# Patient Record
Sex: Male | Born: 1990 | Race: White | Hispanic: No | Marital: Married | Smoking: Never smoker
Health system: Southern US, Community
[De-identification: ages and names within clinical notes are randomized; demographics above are authoritative.]

---

## 2015-05-10 ENCOUNTER — Encounter (HOSPITAL_COMMUNITY): Payer: Self-pay | Admitting: Emergency Medicine

## 2015-05-10 ENCOUNTER — Emergency Department (INDEPENDENT_AMBULATORY_CARE_PROVIDER_SITE_OTHER): Payer: Self-pay

## 2015-05-10 ENCOUNTER — Emergency Department (HOSPITAL_COMMUNITY)
Admission: EM | Admit: 2015-05-10 | Discharge: 2015-05-10 | Disposition: A | Discharge status: Home / Self Care | Payer: Self-pay | Source: Home / Self Care | Attending: Emergency Medicine | Admitting: Emergency Medicine

## 2015-05-10 DIAGNOSIS — S9032XA Contusion of left foot, initial encounter: Secondary | ICD-10-CM

## 2015-05-10 MED ORDER — IBUPROFEN 800 MG PO TABS: 800.0000 mg | ORAL_TABLET | Freq: Three times a day (TID) | ORAL | Status: AC | PRN

## 2015-05-10 NOTE — Discharge Instructions (Signed)
You have some bruising of your heel. Nothing is broken. Apply ice as often as you can to help with the bruising and pain. Take ibuprofen 800 mg 3 times a day as needed for pain. Use the crutches as needed. If things are not improving in 1 month, please follow-up with the orthopedic doctor.

## 2015-05-10 NOTE — ED Notes (Signed)
Left ankle pain that started Saturday.  Patient was jumping on a trampoline, went through trampoline and injured left ankle.  Visible bruising around edge of heel. Pain below lateral ankle.  Able to move toes, palpable pulses.  Patient reports intermittent tingling in little toe

## 2015-05-10 NOTE — ED Provider Notes (Signed)
CSN: [id number]     Arrival date & time 05/10/15  1302 History   First MD Initiated Contact with Patient 05/10/15 1321     Chief Complaint  Patient presents with  . Ankle Pain   (Consider location/radiation/quality/duration/timing/severity/associated sxs/prior Treatment) HPI He is a 24 year old man here for evaluation of left foot injury. On Saturday, he was playing around on his children's trampoline when he went through the trampoline, landing hard on his left foot. He states he has been able to hobble around, but there is significant pain with weightbearing. He states the pain is mostly in the posterior ankle and heel. He has bruising and some swelling.  History reviewed. No pertinent past medical history. History reviewed. No pertinent past surgical history. No family history on file. Social History  Substance Use Topics  . Smoking status: Never Smoker   . Smokeless tobacco: None  . Alcohol Use: Yes    Review of Systems As in history of present illness Allergies  Review of patient's allergies indicates no known allergies.  Home Medications   Prior to Admission medications   Medication Sig Start Date End Date Taking? Authorizing Provider  acetaminophen (TYLENOL) 325 MG tablet Take 650 mg by mouth every 6 (six) hours as needed.   Yes Historical Provider, MD  ibuprofen (ADVIL,MOTRIN) 800 MG tablet Take 1 tablet (800 mg total) by mouth every 8 (eight) hours as needed for moderate pain. 05/10/15   Charm Rings, MD   BP 122/78 mmHg  Pulse 63  Temp(Src) 98.5 F (36.9 C) (Oral)  Resp 14  SpO2 99% Physical Exam  Constitutional: He is oriented to person, place, and time. He appears well-developed and well-nourished. No distress.  Cardiovascular: Normal rate.   Pulmonary/Chest: Effort normal.  Musculoskeletal:  Left ankle: He has bruising across the posterior heel as well as the dorsal foot. He is tender at the posterior edge of the lateral malleolus. He is also tender across the  posterior heel and ankle. 2+ DP pulse. He is able to wiggle his toes. Sensation grossly intact to light touch.  Neurological: He is alert and oriented to person, place, and time.    ED Course  Procedures (including critical care time) Labs Review Labs Reviewed - No data to display  Imaging Review Dg Ankle Complete Left  05/10/2015   CLINICAL DATA:  [place] Seat when trampoline broke while jumping on it 3 days ago, foot injury, injured medial malleolar region of LEFT ankle, constant pain, hematoma  EXAM: LEFT ANKLE COMPLETE - 3+ VIEW  COMPARISON:  None  FINDINGS: Diffuse soft tissue swelling.  Ankle mortise intact.  Osseous mineralization normal.  No acute fracture, dislocation, or bone destruction.  IMPRESSION: No acute osseous abnormalities.   Electronically Signed   By: Ulyses Southward M.D.   On: 05/10/2015 14:22   Dg Foot Complete Left  05/10/2015   CLINICAL DATA:  Patient injured his foot jumping on trampoline 3 days ago. Medial left foot hematoma.  EXAM: LEFT FOOT - COMPLETE 3+ VIEW  COMPARISON:  None.  FINDINGS: No evidence of acute fracture or subluxation. No focal bone lesion or bone destruction. Bone cortex and trabecular architecture appear intact. No radiopaque soft tissue foreign bodies.  IMPRESSION: No acute osseous abnormality identified.   Electronically Signed   By: Ted Mcalpine M.D.   On: 05/10/2015 14:40     MDM   1. Contusion of left heel, initial encounter    X-rays negative. Crutches given to use as needed. Recommended frequent icing  and ibuprofen as needed. Follow-up with orthopedics if no improvement in the next month.    Charm Rings, MD 05/10/15 1500

## 2016-08-20 IMAGING — DX DG ANKLE COMPLETE 3+V*L*
3 series · 3 of 3 positions shown · non-contrast
Comparison: None

CLINICAL DATA: Fell when trampoline broke while jumping on it 3
days ago, foot injury, injured medial malleolar region of LEFT
ankle, constant pain, hematoma

EXAM:
LEFT ANKLE COMPLETE - 3+ VIEW

[ankle ap]
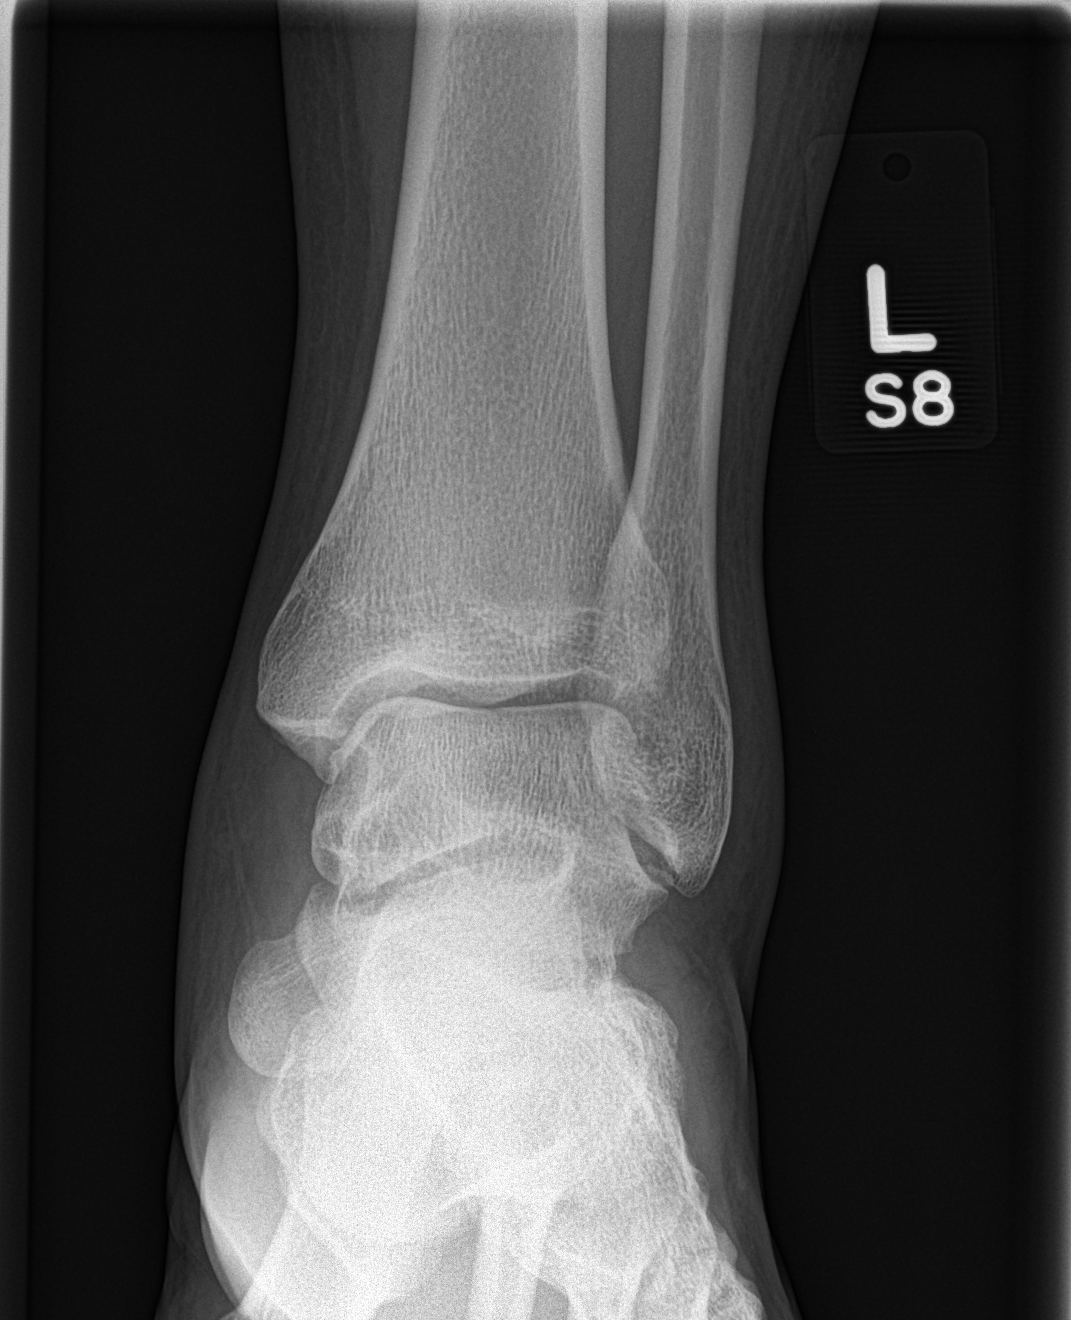

[ankle obl]
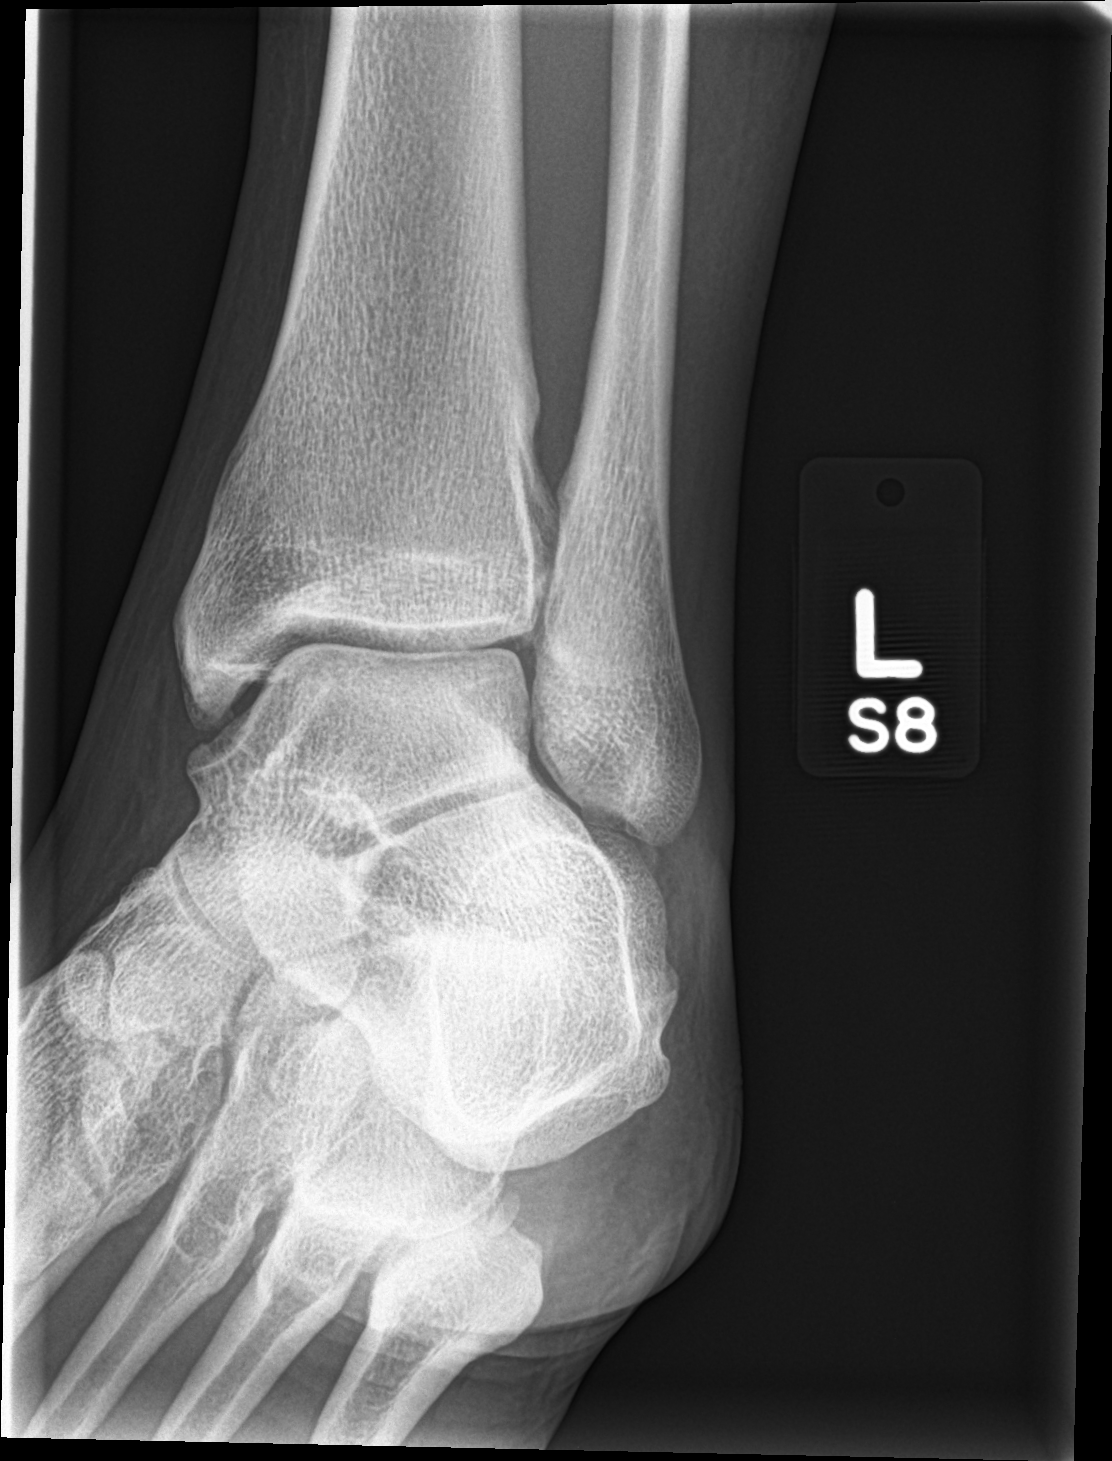

[ankle lat]
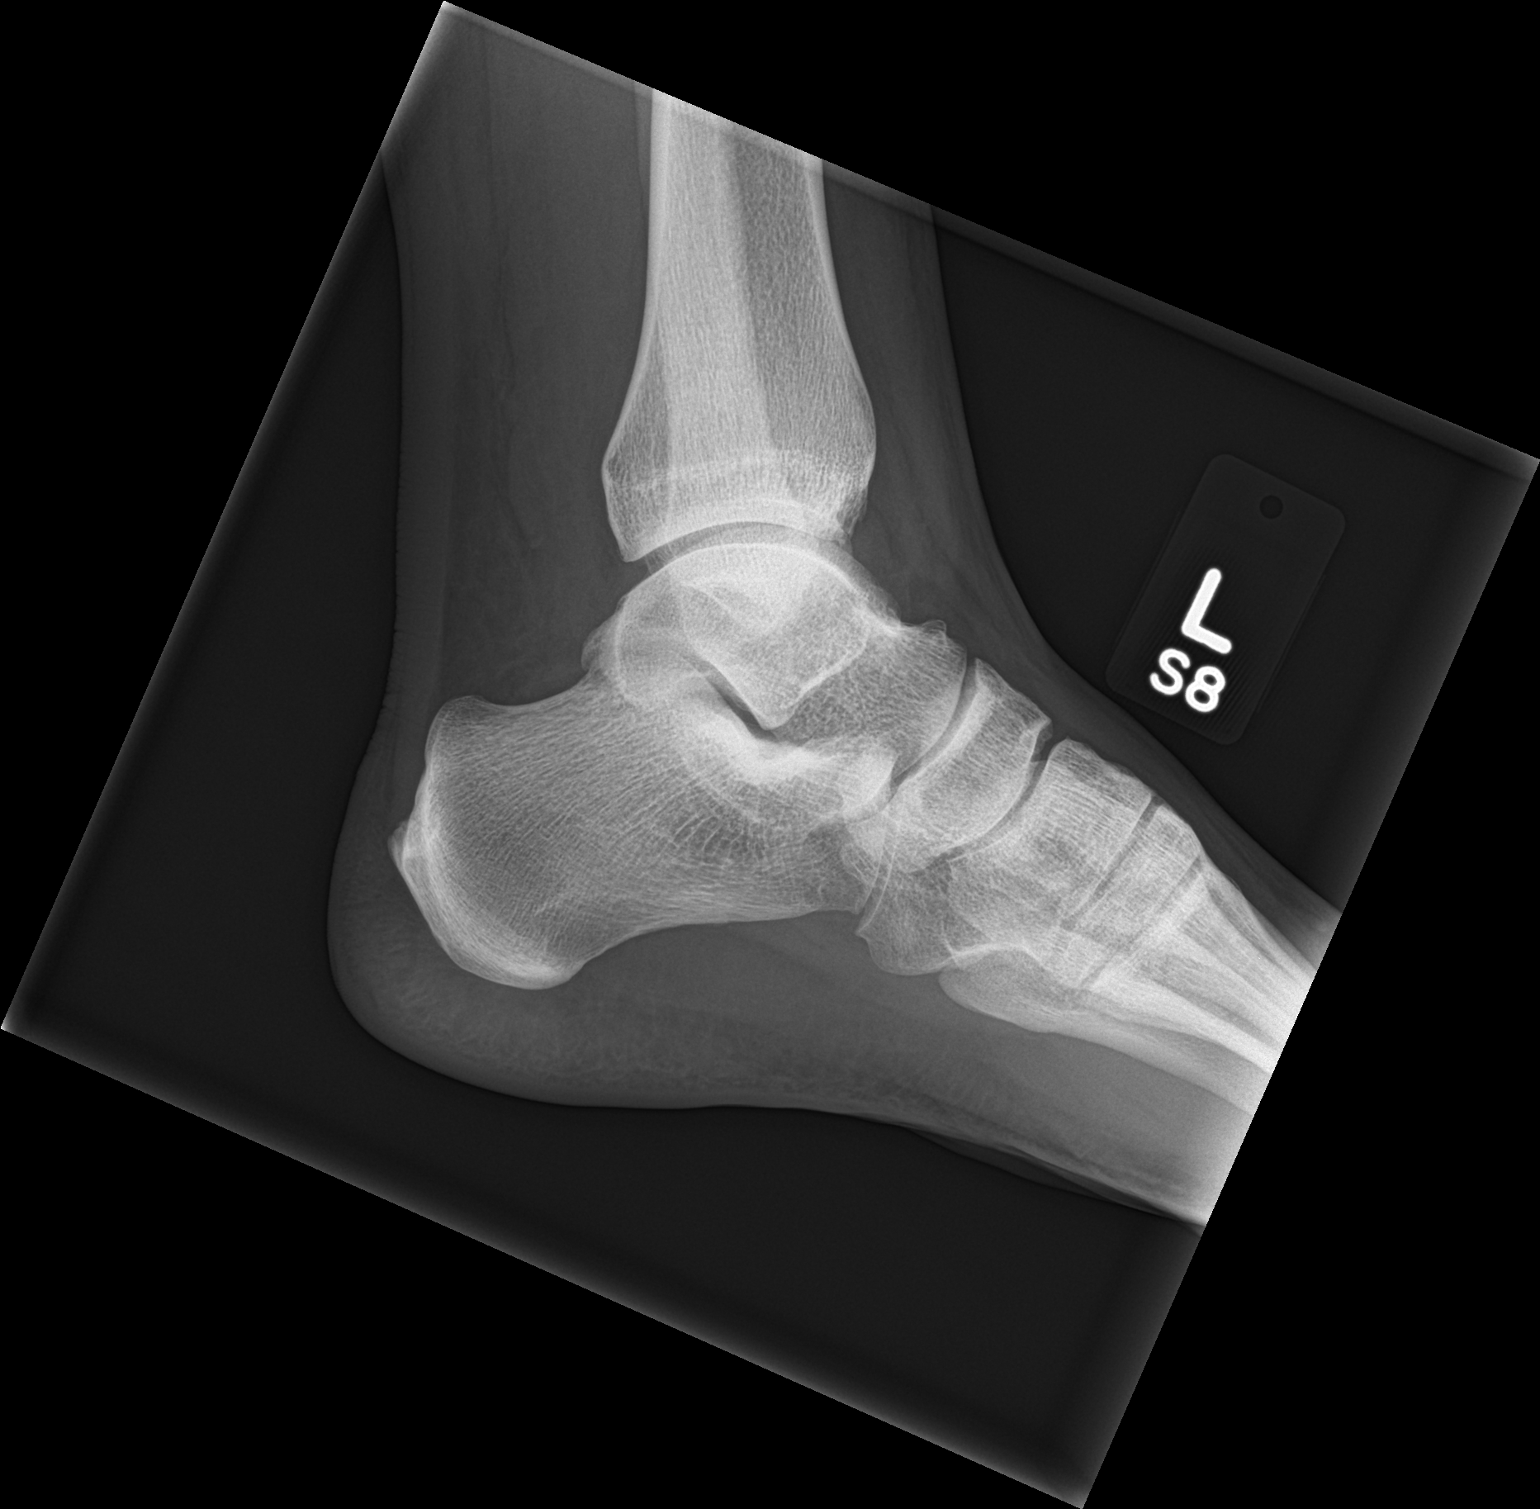

[3 of 3 positions shown; findings below may reference images not displayed]

FINDINGS: Diffuse soft tissue swelling.

Ankle mortise intact.

Osseous mineralization normal.

No acute fracture, dislocation, or bone destruction.
IMPRESSION: No acute osseous abnormalities.

## 2016-08-20 IMAGING — DX DG FOOT COMPLETE 3+V*L*
3 series · 3 of 3 positions shown · non-contrast
Comparison: None.

CLINICAL DATA: Patient injured his foot jumping on trampoline 3
days ago. Medial left foot hematoma.

EXAM:
LEFT FOOT - COMPLETE 3+ VIEW

[foot ap]
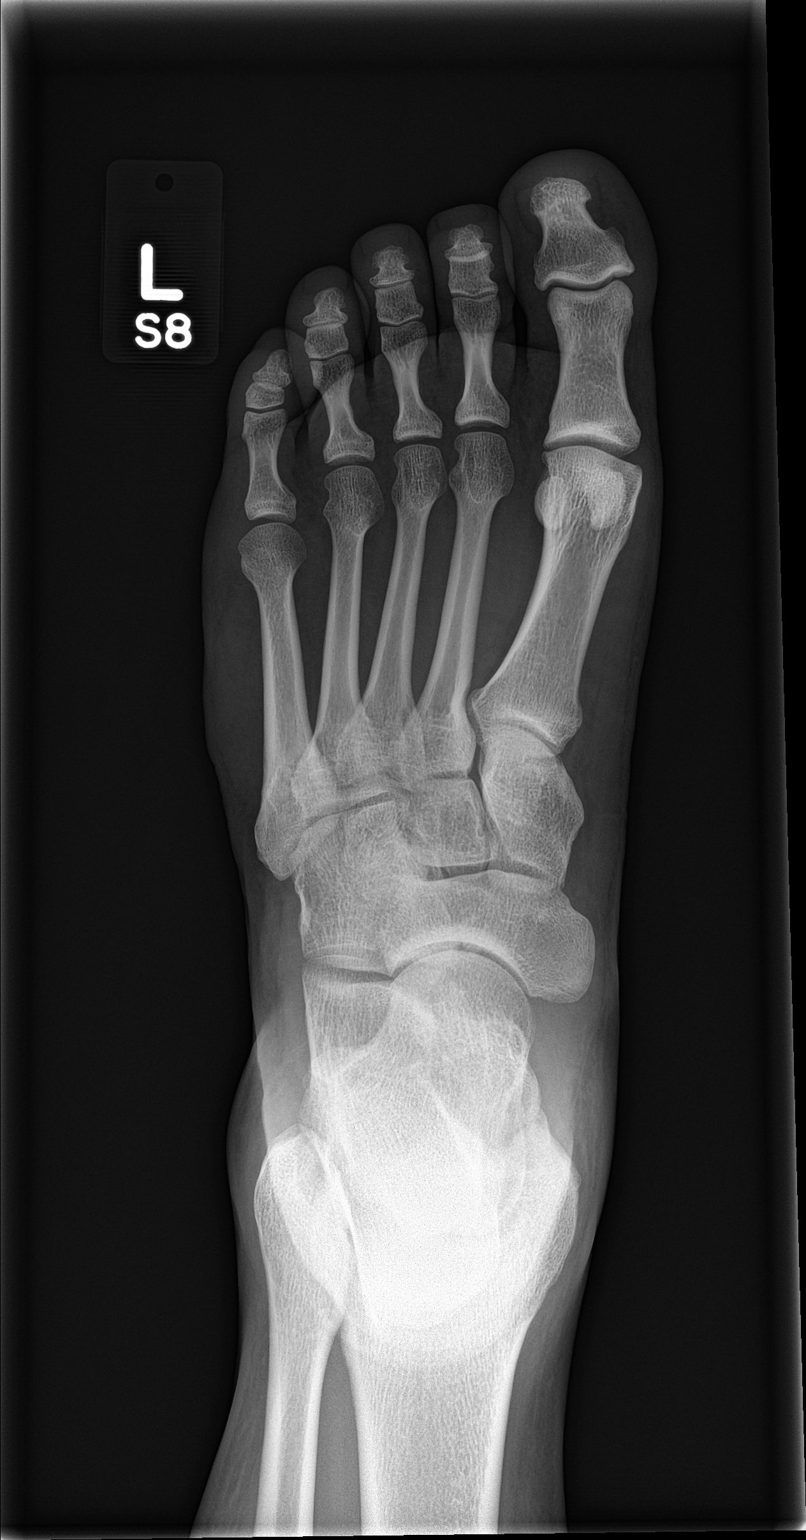

[foot obl]
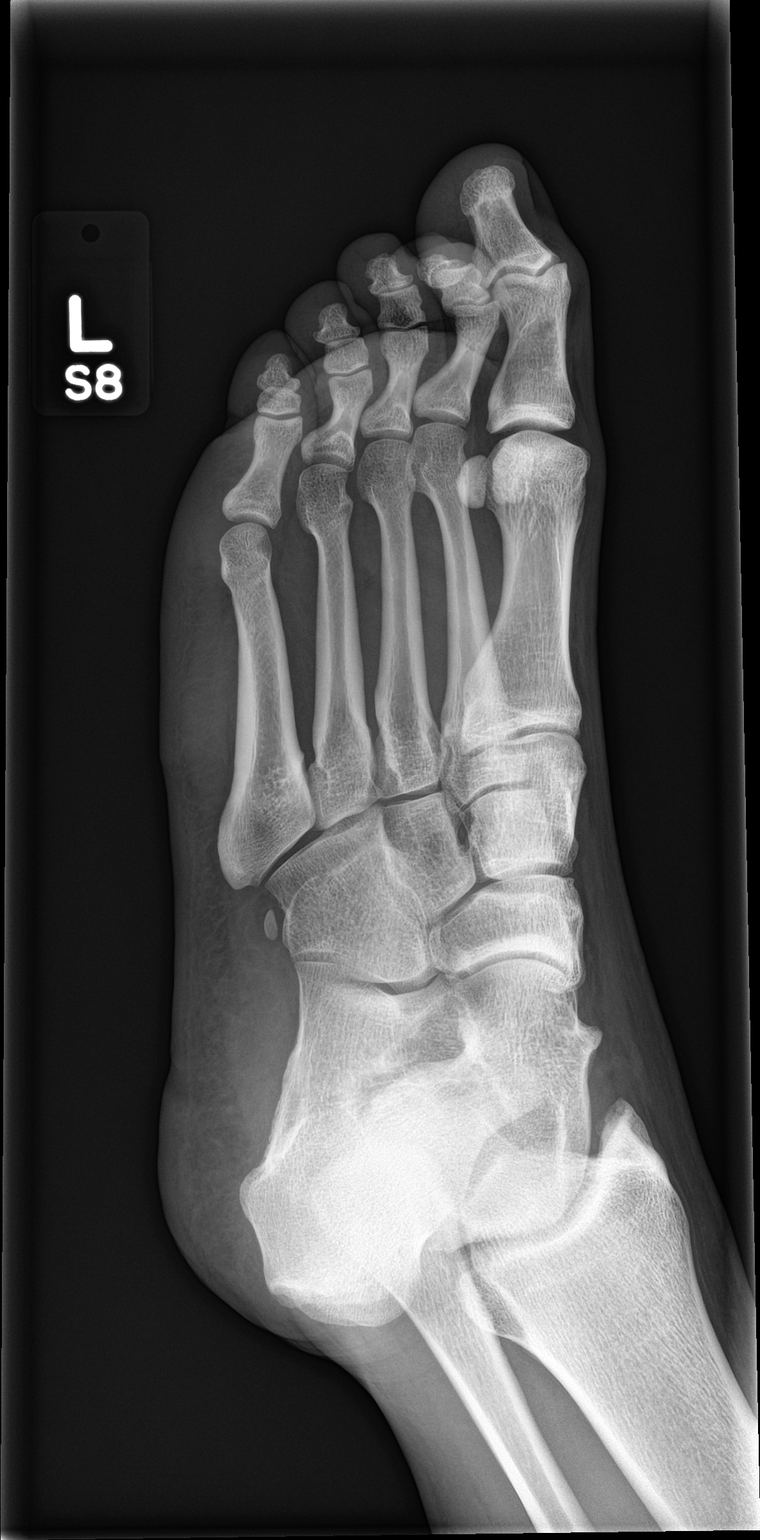

[foot lat]
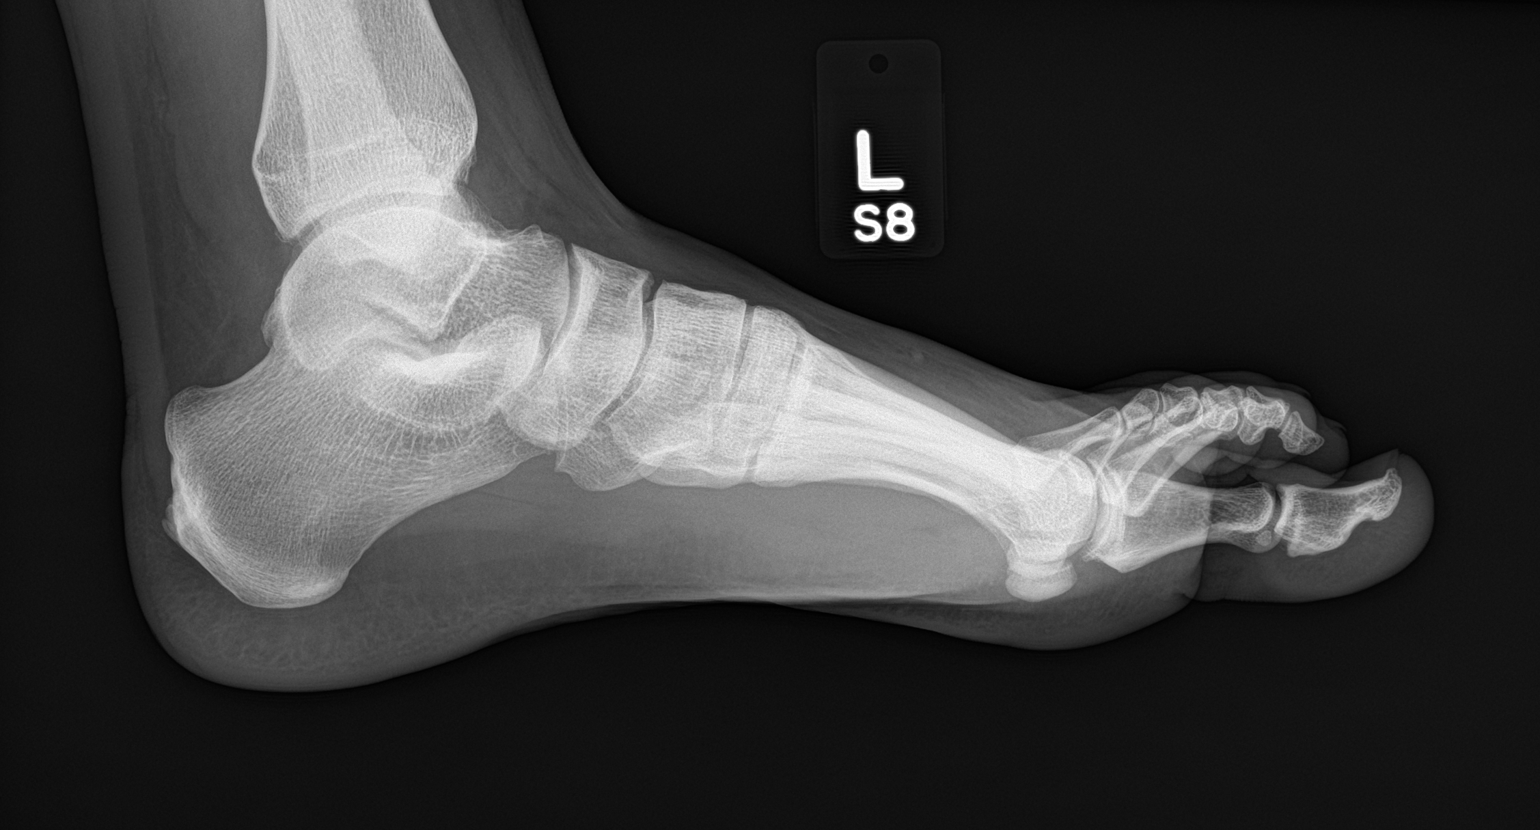

[3 of 3 positions shown; findings below may reference images not displayed]

FINDINGS: No evidence of acute fracture or subluxation. No focal bone lesion
or bone destruction. Bone cortex and trabecular architecture appear
intact. No radiopaque soft tissue foreign bodies.
IMPRESSION: No acute osseous abnormality identified.

## 2019-08-26 ENCOUNTER — Other Ambulatory Visit: Payer: Self-pay

## 2019-08-26 DIAGNOSIS — Z20822 Contact with and (suspected) exposure to covid-19: Secondary | ICD-10-CM

## 2019-08-27 LAB — NOVEL CORONAVIRUS, NAA: SARS-CoV-2, NAA: NOT DETECTED
